# Patient Record
Sex: Female | Born: 1955 | Race: White | Hispanic: Yes | Marital: Married | Smoking: Never smoker
Health system: Southern US, Community
[De-identification: ages and names within clinical notes are randomized; demographics above are authoritative.]

## PROBLEM LIST (undated history)

## (undated) DIAGNOSIS — R112 Nausea with vomiting, unspecified: Secondary | ICD-10-CM

## (undated) DIAGNOSIS — E538 Deficiency of other specified B group vitamins: Secondary | ICD-10-CM

## (undated) DIAGNOSIS — F32A Depression, unspecified: Secondary | ICD-10-CM

## (undated) DIAGNOSIS — E785 Hyperlipidemia, unspecified: Secondary | ICD-10-CM

## (undated) DIAGNOSIS — I10 Essential (primary) hypertension: Secondary | ICD-10-CM

## (undated) DIAGNOSIS — G4730 Sleep apnea, unspecified: Secondary | ICD-10-CM

## (undated) DIAGNOSIS — M1990 Unspecified osteoarthritis, unspecified site: Secondary | ICD-10-CM

## (undated) DIAGNOSIS — K219 Gastro-esophageal reflux disease without esophagitis: Secondary | ICD-10-CM

## (undated) HISTORY — PX: EYE SURGERY: SHX253

## (undated) HISTORY — DX: Hyperlipidemia, unspecified: E78.5

## (undated) HISTORY — PX: TOTAL KNEE ARTHROPLASTY: SHX125

## (undated) HISTORY — PX: CATARACT EXTRACTION: SUR2

## (undated) HISTORY — DX: Essential (primary) hypertension: I10

---

## 2012-03-31 DIAGNOSIS — M533 Sacrococcygeal disorders, not elsewhere classified: Secondary | ICD-10-CM | POA: Insufficient documentation

## 2012-03-31 DIAGNOSIS — K219 Gastro-esophageal reflux disease without esophagitis: Secondary | ICD-10-CM | POA: Insufficient documentation

## 2012-03-31 DIAGNOSIS — G4710 Hypersomnia, unspecified: Secondary | ICD-10-CM | POA: Insufficient documentation

## 2017-07-31 ENCOUNTER — Ambulatory Visit
Admission: RE | Admit: 2017-07-31 | Discharge: 2017-07-31 | Disposition: A | Discharge status: Home / Self Care | Payer: BC Managed Care – PPO | Source: Ambulatory Visit | Attending: Physician Assistant | Admitting: Physician Assistant

## 2017-07-31 ENCOUNTER — Encounter (INDEPENDENT_AMBULATORY_CARE_PROVIDER_SITE_OTHER): Payer: Self-pay

## 2017-07-31 ENCOUNTER — Other Ambulatory Visit: Payer: Self-pay | Admitting: Physician Assistant

## 2017-07-31 DIAGNOSIS — R05 Cough: Secondary | ICD-10-CM

## 2017-07-31 DIAGNOSIS — R059 Cough, unspecified: Secondary | ICD-10-CM

## 2017-08-11 ENCOUNTER — Ambulatory Visit (INDEPENDENT_AMBULATORY_CARE_PROVIDER_SITE_OTHER): Payer: BC Managed Care – PPO | Admitting: Internal Medicine

## 2017-08-11 ENCOUNTER — Institutional Professional Consult (permissible substitution): Payer: BC Managed Care – PPO | Admitting: Internal Medicine

## 2017-08-11 ENCOUNTER — Encounter: Payer: Self-pay | Admitting: Internal Medicine

## 2017-08-11 VITALS — BP 142/70 | HR 68 | Resp 16 | Ht 68.0 in | Wt 230.0 lb

## 2017-08-11 DIAGNOSIS — K219 Gastro-esophageal reflux disease without esophagitis: Secondary | ICD-10-CM

## 2017-08-11 DIAGNOSIS — J310 Chronic rhinitis: Secondary | ICD-10-CM | POA: Diagnosis not present

## 2017-08-11 DIAGNOSIS — R059 Cough, unspecified: Secondary | ICD-10-CM

## 2017-08-11 DIAGNOSIS — G4733 Obstructive sleep apnea (adult) (pediatric): Secondary | ICD-10-CM

## 2017-08-11 DIAGNOSIS — R05 Cough: Secondary | ICD-10-CM | POA: Diagnosis not present

## 2017-08-11 NOTE — Patient Instructions (Signed)
Start nasal spray.  Try to stop using cough drops, instead use biotene mouthwash at night. Drink water at night for dryness.

## 2017-08-11 NOTE — Progress Notes (Signed)
[facility] [place] [facility] Consultation      Assessment and Plan:  Persistent cough, with chronic rhinitis and GERD. - Patient has a persistent cough, with copious nasal drainage, also with some reflux symptoms.  She was recently seen ENT who recommended the patient start empirically on nasal steroid. - Agree with initiation of nasal steroid, recommended that she go and start nasal steroid as recommended.  Patient is already on omeprazole for GERD which appears to be adequately controlled at this time.  Obstructive sleep apnea on CPAP with dryness of the oral mucosa. - Patient is currently taking 6-8 cough drops per night while sleeping with CPAP due to oral mucosal dryness.  I discussed with her that this can potentiate cough, and actually make the oromucosa more dry.  In addition it can be a potential choking hazard. - Recommended that she start Biotene mouthwash nightly as well as drink/swish water as needed at night for oral dryness.  Patient is currently taking Vesicare which may be contributing to this. - We discussed that once her nasal congestion is improved she may benefit from a switch to a nasal mask, as CPAP via full facemask can contribute to dryness of the oral mucosa.  Possible COPD/chronic bronchitis. - Chest x-ray images reviewed, suggestive of possible hyperinflation. - Currently patient denies any symptoms of dyspnea, she is a non-smoker and denies any significant exposures no apparent indication to initiate inhalers at this time.  - She is asked to call us back in 4 to 6 weeks if above measures have not helped.  Date: 08/11/2017  MRN# [id number] Marcia Gordon [birth year]   Marcia Gordon is a 62 y.o. old female seen in consultation for chief complaint of:    Chief Complaint  Patient presents with  . Consult    referred by Meta Hatchet PA---ENT for cough eval.  . Cough    since Nov 2018 clear mucus. Pt denies wheezing,sob and or chest tightness.    HPI:    She has been having a chronic cough. When she gets a bad cold she gets a persistent cough, this happened this past november. She has continued to cough since November. She was seen by ENT and thought to have LPR.  She denies dyspnea.  She has been on no cough medicine because she has tried them in the past and not had any relief with them. She has continued trouble with nasal congestion. She takes vesicare and finds that her mouth is very dry. When she puts on CPAP at night her mouth is very dry so she uses cough drops all night. She uses 6 or 8 cough drops per night, this helps with the dryness of her mouth.  She has never tried biotene mouthwash, she has not been on nasal sprays though she was advised to start nasacort she has not started it yet.  Her cpap mask goes over her nose and mouth, she has been on CPAP for about 4 years, and it has always dried out her mouth.   She has constant nasal drainage.  She has reflux, controlled with omeprazole.  She has a dog, in the bedroom. She has never had allergy testing. She has tried taking allegra and id nto notice a difference.  Patient denies dyspnea on exertion, dyspnea is of any kind, hemoptysis, any other respiratory issues.  Imaging personally reviewed, chest x-ray 07/31/2017; hyperinflation consistent with COPD. CBC 01/27/2017; absolute eosinophil count 280.   PMHX:   Past Medical History:  Diagnosis  Date  . Hyperlipidemia   . Hypertension    Surgical Hx:  History reviewed. No pertinent surgical history. Family Hx:  History reviewed. No pertinent family history. Social Hx:   Social History   Tobacco Use  . Smoking status: Never Smoker  . Smokeless tobacco: Never Used  Substance Use Topics  . Alcohol use: Never    Frequency: Never  . Drug use: Never   Medication:    Current Outpatient Medications:  .  Calcium Carb-Cholecalciferol (CALCIUM 1000 + D PO), Take 1 capsule by mouth daily., Disp: , Rfl:  .  clonazePAM (KLONOPIN) 1  MG tablet, Take 1 mg by mouth as needed., Disp: , Rfl:  .  FLUoxetine (PROZAC) 20 MG capsule, Take 20 mg by mouth daily., Disp: , Rfl:  .  meloxicam (MOBIC) 15 MG tablet, Take 15 mg by mouth daily., Disp: , Rfl:  .  omeprazole (PRILOSEC) 40 MG capsule, Take 40 mg by mouth daily., Disp: , Rfl:  .  pravastatin (PRAVACHOL) 20 MG tablet, Take 20 mg by mouth at bedtime., Disp: , Rfl:  .  traZODone (DESYREL) 50 MG tablet, Take 50 mg by mouth at bedtime., Disp: , Rfl:  .  triamterene-hydrochlorothiazide (MAXZIDE-25) 37.5-25 MG tablet, Take 1 tablet by mouth daily., Disp: , Rfl:  .  VESICARE 10 MG tablet, Take 10 mg by mouth daily., Disp: , Rfl: 1   Allergies:  Sulfa antibiotics  Review of Systems: Gen:  Denies  fever, sweats, chills HEENT: Denies blurred vision, double vision. bleeds, sore throat Cvc:  No dizziness, chest pain. Resp:   Denies cough or sputum production, shortness of breath Gi: Denies swallowing difficulty, stomach pain. Gu:  Denies bladder incontinence, burning urine Ext:   No Joint pain, stiffness. Skin: No skin rash,  hives  Endoc:  No polyuria, polydipsia. Psych: No depression, insomnia. Other:  All other systems were reviewed with the patient and were negative other that what is mentioned in the HPI.   Physical Examination:   VS: BP (!) 142/70 (BP Location: Left Arm, Cuff Size: Large)   Pulse 68   Resp 16   Ht 5\' 8"  (1.727 m)   Wt 230 lb (104.3 kg)   SpO2 98%   BMI 34.97 kg/m   General Appearance: No distress  Neuro:without focal findings,  speech normal,  HEENT: PERRLA, EOM intact.   Pulmonary: normal breath sounds, No wheezing.  CardiovascularNormal S1,S2.  No m/r/g.   Abdomen: Benign, Soft, non-tender. Renal:  No costovertebral tenderness  GU:  No performed at this time. Endoc: No evident thyromegaly, no signs of acromegaly. Skin:   warm, no rashes, no ecchymosis  Extremities: normal, no cyanosis, clubbing.  Other findings:    LABORATORY PANEL:    CBC No results for input(s): WBC, HGB, HCT, PLT in the last 168 hours. ------------------------------------------------------------------------------------------------------------------  Chemistries  No results for input(s): NA, K, CL, CO2, GLUCOSE, BUN, CREATININE, CALCIUM, MG, AST, ALT, ALKPHOS, BILITOT in the last 168 hours.  Invalid input(s): GFRCGP ------------------------------------------------------------------------------------------------------------------  Cardiac Enzymes No results for input(s): TROPONINI in the last 168 hours. ------------------------------------------------------------  RADIOLOGY:  No results found.     Thank  you for the consultation and for allowing [facility] to assist in the care of your patient. Our recommendations are noted above.  Please contact us if we can be of further service.   Wells Guiles, MD.  Board Certified in Internal Medicine, Pulmonary Medicine, Critical Care Medicine, and Sleep Medicine.  [facility] Office Number: [phone]  Santiago Gladavid Kasa, M.D.  Billy Fischeravid Simonds, M.D  08/11/2017

## 2019-12-08 IMAGING — CR DG CHEST 2V
2 series · 3 of 3 positions shown · non-contrast
Comparison: None.

CLINICAL DATA: Cough for several months

EXAM:
CHEST - 2 VIEW

[Series 1: chest pa · 0.14mm/px · 2 of 2 slices shown]
[im 1/2]
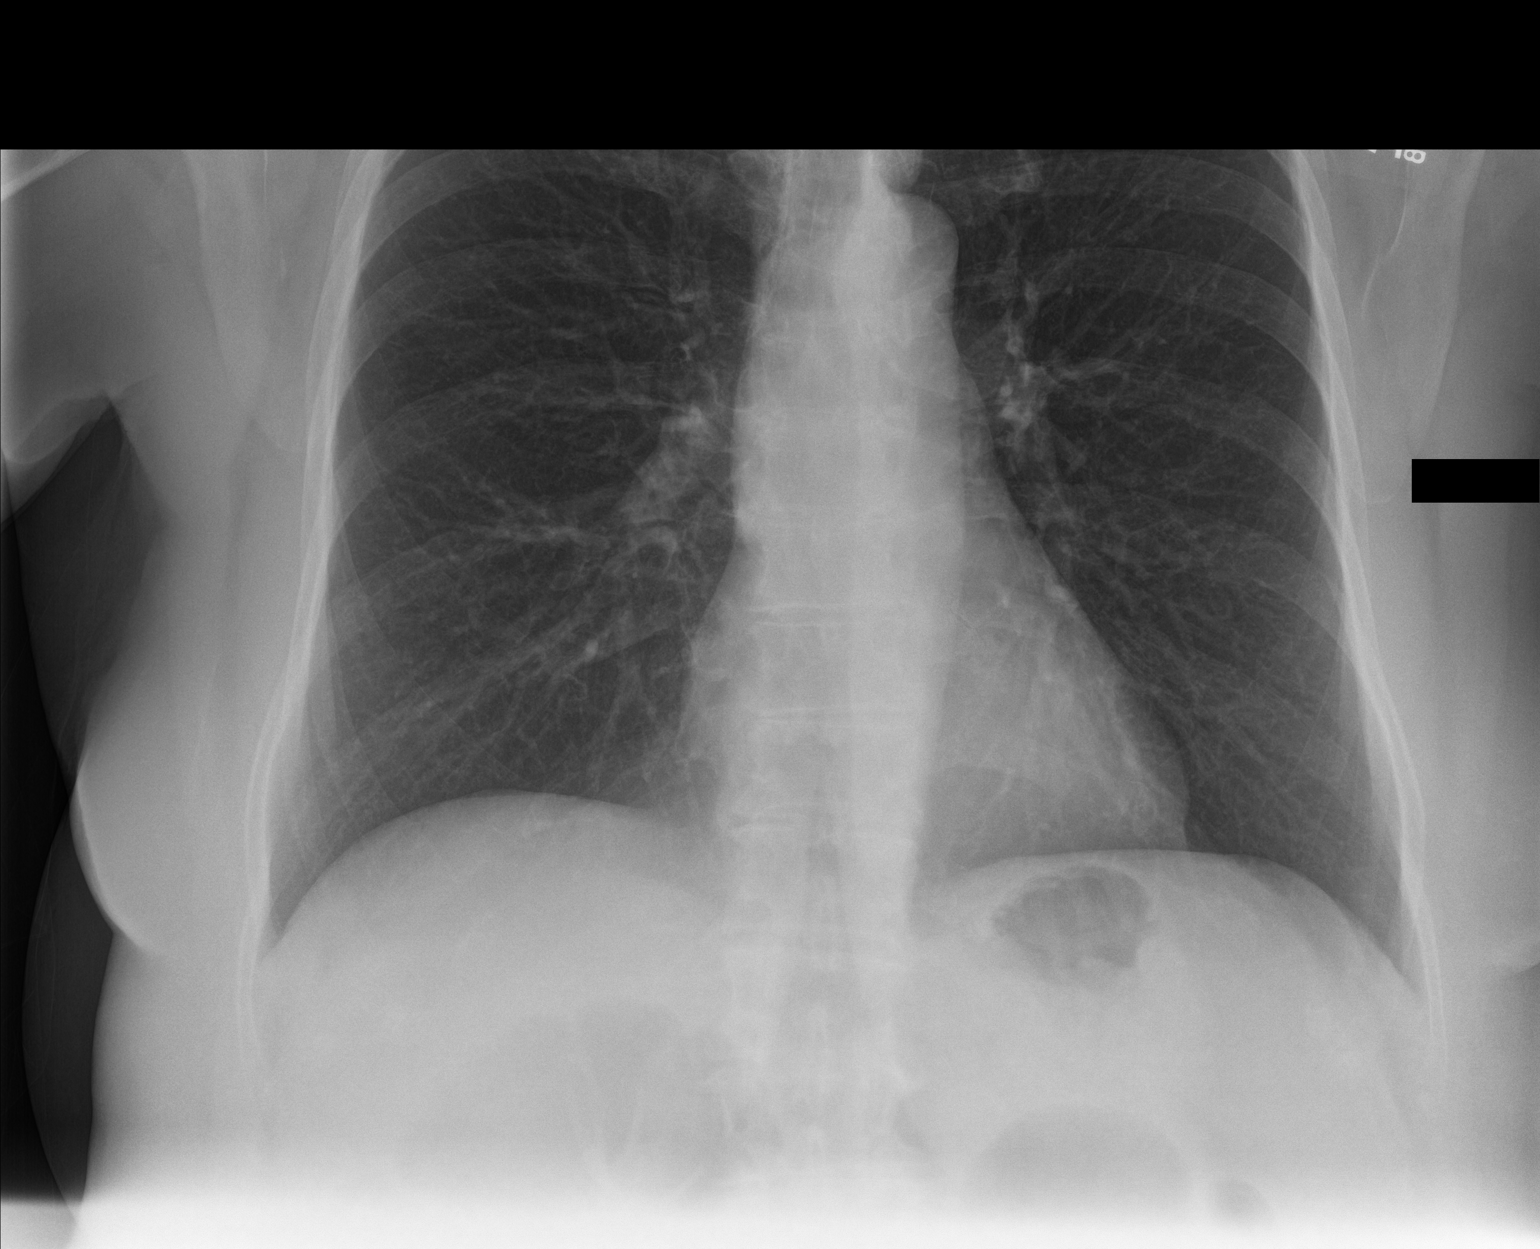
[im 2/2]
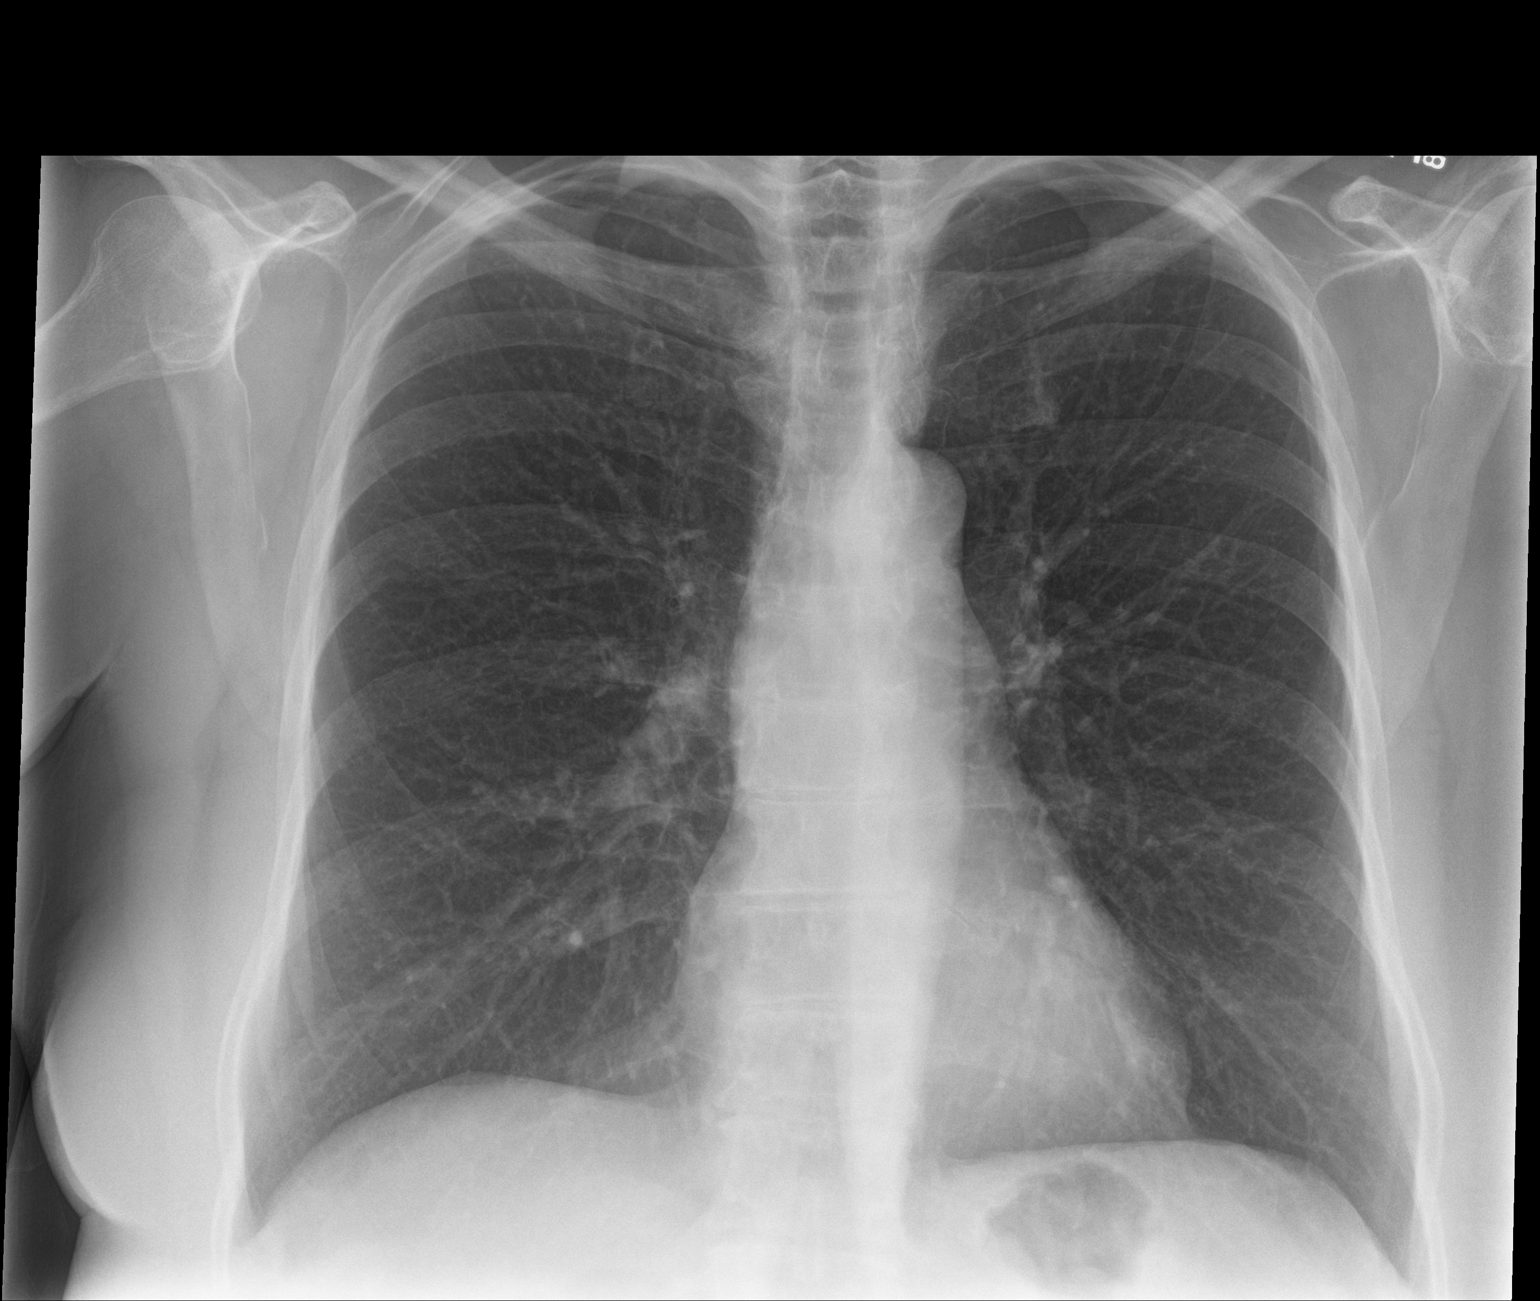

[chest lat]
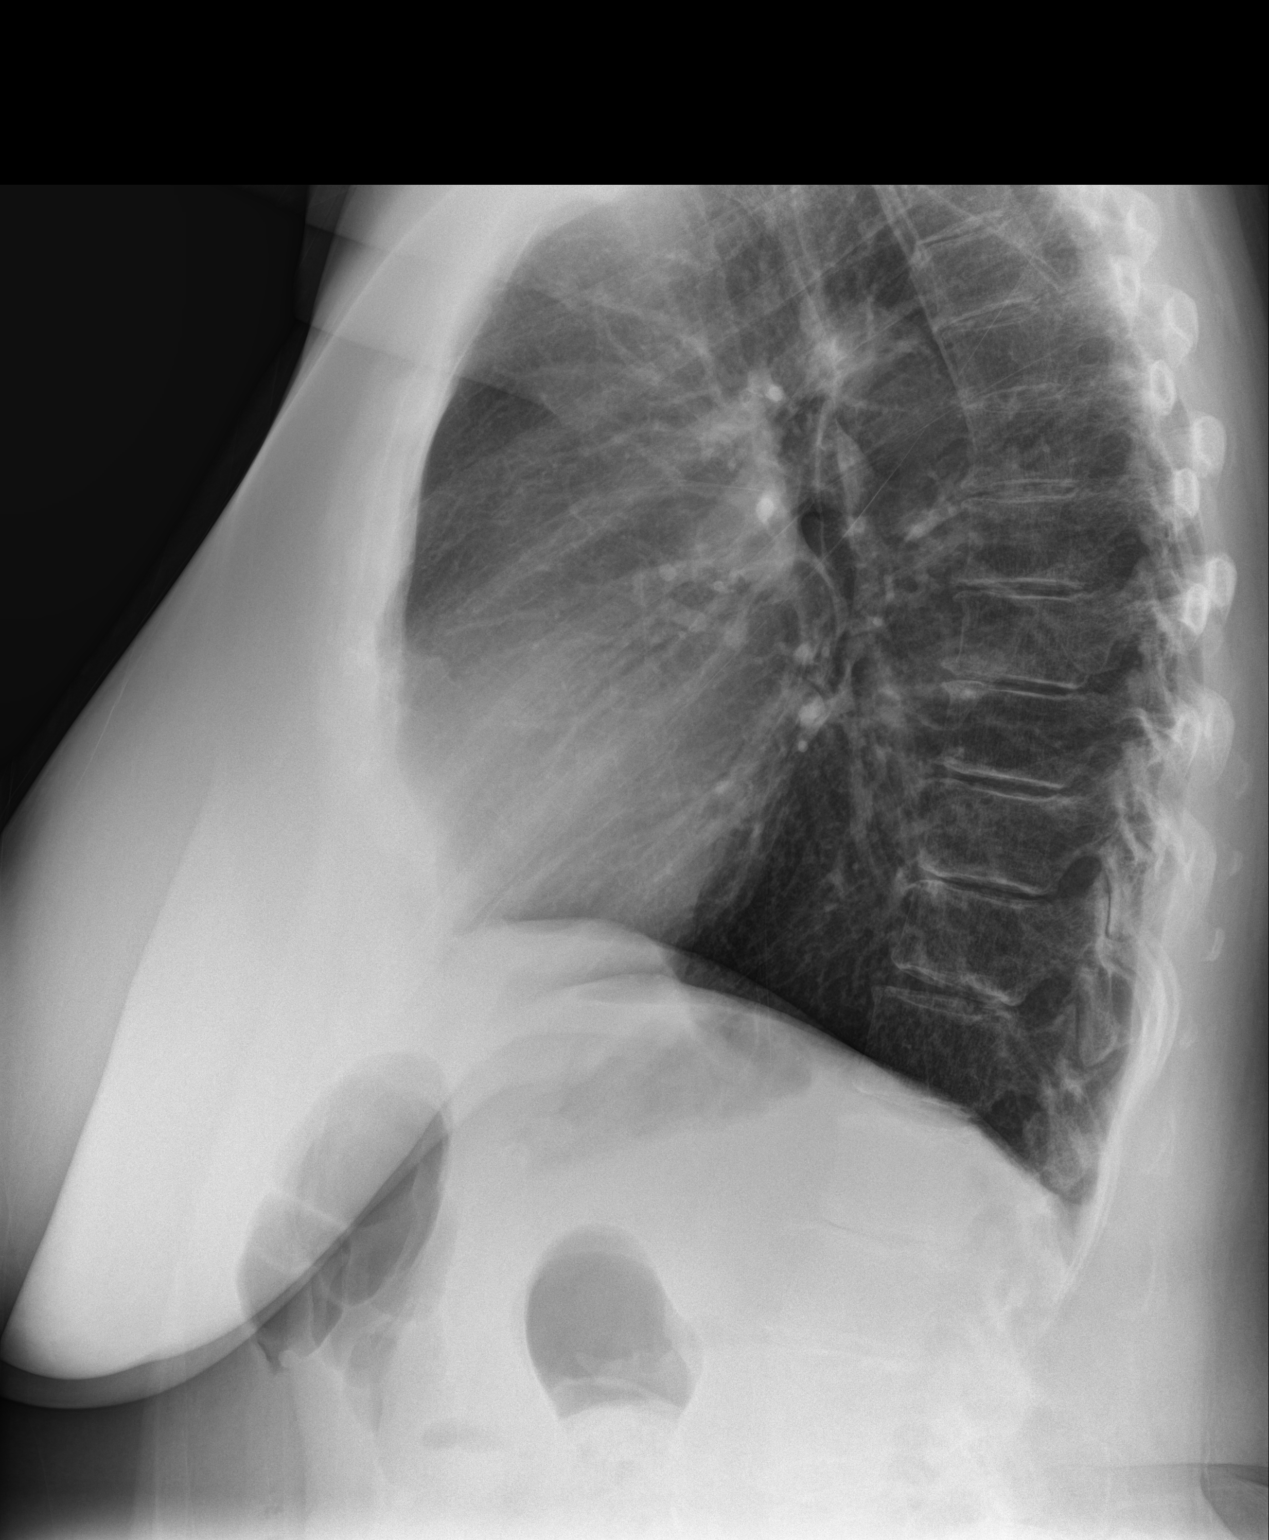

[3 of 3 positions shown; findings below may reference images not displayed]

FINDINGS: No active infiltrate or effusion is seen. The lungs are minimally
hyperaerated. Mediastinal and hilar contours are unremarkable. The
heart is within normal limits in size. No bony abnormality is seen.
IMPRESSION: No active lung disease.  Very minimal hyperaeration.

## 2020-07-04 ENCOUNTER — Other Ambulatory Visit: Payer: Self-pay | Admitting: Physician Assistant

## 2020-07-04 DIAGNOSIS — Z1231 Encounter for screening mammogram for malignant neoplasm of breast: Secondary | ICD-10-CM

## 2024-01-22 ENCOUNTER — Other Ambulatory Visit: Payer: Self-pay | Admitting: Podiatry

## 2024-01-27 ENCOUNTER — Inpatient Hospital Stay
Admission: RE | Admit: 2024-01-27 | Discharge: 2024-01-27 | Disposition: A | Discharge status: Home / Self Care | Source: Ambulatory Visit

## 2024-01-27 HISTORY — DX: Unspecified osteoarthritis, unspecified site: M19.90

## 2024-01-27 HISTORY — DX: Nausea with vomiting, unspecified: R11.2

## 2024-01-27 HISTORY — DX: Sleep apnea, unspecified: G47.30

## 2024-01-27 HISTORY — DX: Depression, unspecified: F32.A

## 2024-01-27 HISTORY — DX: Deficiency of other specified B group vitamins: E53.8

## 2024-01-27 HISTORY — DX: Gastro-esophageal reflux disease without esophagitis: K21.9

## 2024-01-27 NOTE — Patient Instructions (Signed)
 Your procedure is scheduled on: FRIDAY 02/06/24 Report to the Registration Desk on the 1st floor of the [facility]. To find out your arrival time, please call [phone] between 1PM - 3PM on: THURSDAY 02/05/24 If your arrival time is 6:00 am, do not arrive before that time as the [facility] entrance doors do not open until 6:00 am.  REMEMBER: Instructions that are not followed completely may result in serious medical risk, up to and including death; or upon the discretion of your surgeon and anesthesiologist your surgery may need to be rescheduled.  Do not eat food after midnight the night before surgery.  No gum chewing or hard candies.  You may however, drink CLEAR liquids up to 2 hours before you are scheduled to arrive for your surgery. Do not drink anything within 2 hours of your scheduled arrival time.  Clear liquids include: - water  - apple juice without pulp - gatorade (not RED colors) - black coffee or tea (Do NOT add milk or creamers to the coffee or tea) Do NOT drink anything that is not on this list.  In addition, your doctor has ordered for you to drink the provided:  Ensure Pre-Surgery Clear Carbohydrate Drink  Drinking this carbohydrate drink up to two hours before surgery helps to reduce insulin resistance and improve patient outcomes. Please complete drinking 2 hours before scheduled arrival time.  One week prior to surgery: Stop Anti-inflammatories (NSAIDS) such as meloxicam (MOBIC),  Advil, Aleve, Ibuprofen, Motrin, Naproxen, Naprosyn and Aspirin based products such as Excedrin, Goody's Powder, BC Powder. Stop ANY OVER THE COUNTER supplements until after surgery.  You may however, continue to take Tylenol if needed for pain up until the day of surgery.  Continue taking all of your other prescription medications up until the day of surgery.  ON THE DAY OF SURGERY ONLY TAKE THESE MEDICATIONS WITH SIPS OF WATER:  clonazePAM (KLONOPIN)  FLUoxetine (PROZAC)   omeprazole (PRILOSEC)   Use inhalers on the day of surgery and bring to the hospital.  No Alcohol for 24 hours before or after surgery.  No Smoking including e-cigarettes for 24 hours before surgery.  No chewable tobacco products for at least 6 hours before surgery.  No nicotine patches on the day of surgery.  Do not use any recreational drugs for at least a week (preferably 2 weeks) before your surgery.  Please be advised that the combination of cocaine and anesthesia may have negative outcomes, up to and including death. If you test positive for cocaine, your surgery will be cancelled.  On the morning of surgery brush your teeth with toothpaste and water, you may rinse your mouth with mouthwash if you wish. Do not swallow any toothpaste or mouthwash.  Use CHG Soap or wipes as directed on instruction sheet.  Do not wear jewelry, make-up, hairpins, clips or nail polish.  For welded (permanent) jewelry: bracelets, anklets, waist bands, etc.  Please have this removed prior to surgery.  If it is not removed, there is a chance that hospital personnel will need to cut it off on the day of surgery.  Do not wear lotions, powders, or perfumes.   Do not shave body hair from the neck down 48 hours before surgery.  Contact lenses, hearing aids and dentures may not be worn into surgery.  Do not bring valuables to the hospital. [facility] is not responsible for any missing/lost belongings or valuables.   Bring your C-PAP to the hospital in case you may  have to spend the night.   Notify your doctor if there is any change in your medical condition (cold, fever, infection).  Wear comfortable clothing (specific to your surgery type) to the hospital.  After surgery, you can help prevent lung complications by doing breathing exercises.  Take deep breaths and cough every 1-2 hours. Your doctor may order a device called an Incentive Spirometer to help you take deep breaths. When coughing or  sneezing, hold a pillow firmly against your incision with both hands. This is called "splinting." Doing this helps protect your incision. It also decreases belly discomfort.  If you are being discharged the day of surgery, you will not be allowed to drive home. You will need a responsible individual to drive you home and stay with you for 24 hours after surgery.   If you are taking public transportation, you will need to have a responsible individual with you.  Please call the Pre-admissions Testing Dept. at [phone] if you have any questions about these instructions.  Surgery Visitation Policy:  Patients having surgery or a procedure may have two visitors.  Children under the age of 19 must have an adult with them who is not the patient.  Merchandiser, Retail to address health-related social needs:  [email or web address]                                                                                                             Preparing for Surgery with CHLORHEXIDINE GLUCONATE (CHG) Soap  Chlorhexidine Gluconate (CHG) Soap  o An antiseptic cleaner that kills germs and bonds with the skin to continue killing germs even after washing  o Used for showering the night before surgery and morning of surgery  Before surgery, you can play an important role by reducing the number of germs on your skin.  CHG (Chlorhexidine gluconate) soap is an antiseptic cleanser which kills germs and bonds with the skin to continue killing germs even after washing.  Please do not use if you have an allergy to CHG or antibacterial soaps. If your skin becomes reddened/irritated stop using the CHG.  1. Shower the NIGHT BEFORE SURGERY with CHG soap.  2. If you choose to wash your hair, wash your hair first as usual with your normal shampoo.  3. After shampooing, rinse your hair and body thoroughly to remove the shampoo.  4. Use CHG as you would any other liquid soap. You can apply  CHG directly to the skin and wash gently with a clean washcloth.  5. Apply the CHG soap to your body only from the neck down. Do not use on open wounds or open sores. Avoid contact with your eyes, ears, mouth, and genitals (private parts). Wash face and genitals (private parts) with your normal soap.  6. Wash thoroughly, paying special attention to the area where your surgery will be performed.  7. Thoroughly rinse your body with warm water.  8. Do not shower/wash with your normal soap after using and rinsing off the CHG soap.  9. Do not use  lotions, oils, etc., after showering with CHG.  10. Pat yourself dry with a clean towel.  11. Wear clean pajamas to bed the night before surgery.  12. Place clean sheets on your bed the night of your shower and do not sleep with pets.  13. Do not apply any deodorants/lotions/powders.  14. Please wear clean clothes to the hospital.  15. Remember to brush your teeth with your regular toothpaste.

## 2024-02-06 ENCOUNTER — Ambulatory Visit: Admit: 2024-02-06 | Payer: Self-pay | Admitting: Podiatry

## 2024-02-06 SURGERY — BUNIONECTOMY, LAPIDUS
Anesthesia: Choice | Site: Foot | Laterality: Right
